# Patient Record
Sex: Female | Born: 1990 | Race: White | Hispanic: No | Marital: Single
Health system: Southern US, Community
[De-identification: ages and names within clinical notes are randomized; demographics above are authoritative.]

---
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
## 2011-01-20 ENCOUNTER — Institutional Professional Consult (permissible substitution) (INDEPENDENT_AMBULATORY_CARE_PROVIDER_SITE_OTHER): Payer: Commercial Managed Care - PPO | Admitting: Pediatrics

## 2011-01-20 DIAGNOSIS — R279 Unspecified lack of coordination: Secondary | ICD-10-CM

## 2011-01-20 DIAGNOSIS — F909 Attention-deficit hyperactivity disorder, unspecified type: Secondary | ICD-10-CM

## 2011-01-20 DIAGNOSIS — R6250 Unspecified lack of expected normal physiological development in childhood: Secondary | ICD-10-CM

## 2011-03-30 ENCOUNTER — Institutional Professional Consult (permissible substitution) (INDEPENDENT_AMBULATORY_CARE_PROVIDER_SITE_OTHER): Payer: Commercial Managed Care - PPO | Admitting: Pediatrics

## 2011-03-30 DIAGNOSIS — F909 Attention-deficit hyperactivity disorder, unspecified type: Secondary | ICD-10-CM

## 2011-03-30 DIAGNOSIS — R6250 Unspecified lack of expected normal physiological development in childhood: Secondary | ICD-10-CM

## 2011-03-30 DIAGNOSIS — R279 Unspecified lack of coordination: Secondary | ICD-10-CM

## 2011-06-14 ENCOUNTER — Encounter (INDEPENDENT_AMBULATORY_CARE_PROVIDER_SITE_OTHER): Payer: Commercial Managed Care - PPO | Admitting: Pediatrics

## 2011-06-14 DIAGNOSIS — F909 Attention-deficit hyperactivity disorder, unspecified type: Secondary | ICD-10-CM

## 2011-06-14 DIAGNOSIS — R279 Unspecified lack of coordination: Secondary | ICD-10-CM

## 2011-06-16 ENCOUNTER — Ambulatory Visit
Admission: RE | Admit: 2011-06-16 | Discharge: 2011-06-16 | Disposition: A | Discharge status: Home / Self Care | Payer: Commercial Managed Care - PPO | Source: Ambulatory Visit | Attending: Allergy | Admitting: Allergy

## 2011-06-16 ENCOUNTER — Other Ambulatory Visit: Payer: Self-pay | Admitting: Allergy

## 2011-06-16 DIAGNOSIS — J209 Acute bronchitis, unspecified: Secondary | ICD-10-CM

## 2011-11-30 ENCOUNTER — Institutional Professional Consult (permissible substitution) (INDEPENDENT_AMBULATORY_CARE_PROVIDER_SITE_OTHER): Payer: Commercial Managed Care - PPO | Admitting: Pediatrics

## 2011-11-30 ENCOUNTER — Institutional Professional Consult (permissible substitution): Payer: Commercial Managed Care - PPO | Admitting: Pediatrics

## 2011-11-30 DIAGNOSIS — F909 Attention-deficit hyperactivity disorder, unspecified type: Secondary | ICD-10-CM

## 2011-11-30 DIAGNOSIS — R279 Unspecified lack of coordination: Secondary | ICD-10-CM

## 2012-01-23 ENCOUNTER — Institutional Professional Consult (permissible substitution) (INDEPENDENT_AMBULATORY_CARE_PROVIDER_SITE_OTHER): Payer: Commercial Managed Care - PPO | Admitting: Pediatrics

## 2012-01-23 DIAGNOSIS — F909 Attention-deficit hyperactivity disorder, unspecified type: Secondary | ICD-10-CM

## 2012-01-23 DIAGNOSIS — R279 Unspecified lack of coordination: Secondary | ICD-10-CM

## 2012-06-06 ENCOUNTER — Institutional Professional Consult (permissible substitution): Payer: Commercial Managed Care - PPO | Admitting: Pediatrics

## 2012-06-11 ENCOUNTER — Institutional Professional Consult (permissible substitution) (INDEPENDENT_AMBULATORY_CARE_PROVIDER_SITE_OTHER): Payer: Commercial Managed Care - PPO | Admitting: Pediatrics

## 2012-06-11 DIAGNOSIS — R279 Unspecified lack of coordination: Secondary | ICD-10-CM

## 2012-06-11 DIAGNOSIS — F909 Attention-deficit hyperactivity disorder, unspecified type: Secondary | ICD-10-CM

## 2012-11-05 ENCOUNTER — Institutional Professional Consult (permissible substitution) (INDEPENDENT_AMBULATORY_CARE_PROVIDER_SITE_OTHER): Payer: Commercial Managed Care - PPO | Admitting: Pediatrics

## 2012-11-05 DIAGNOSIS — F909 Attention-deficit hyperactivity disorder, unspecified type: Secondary | ICD-10-CM

## 2012-11-05 DIAGNOSIS — R279 Unspecified lack of coordination: Secondary | ICD-10-CM

## 2013-05-26 IMAGING — CR DG CHEST 2V
2 series · 2 of 2 positions shown · non-contrast
Comparison: PA and lateral chest 03/29/2008.

CLINICAL DATA: Cough and shortness of breath.

CHEST - 2 VIEW

[view not recorded (1 of 2)]
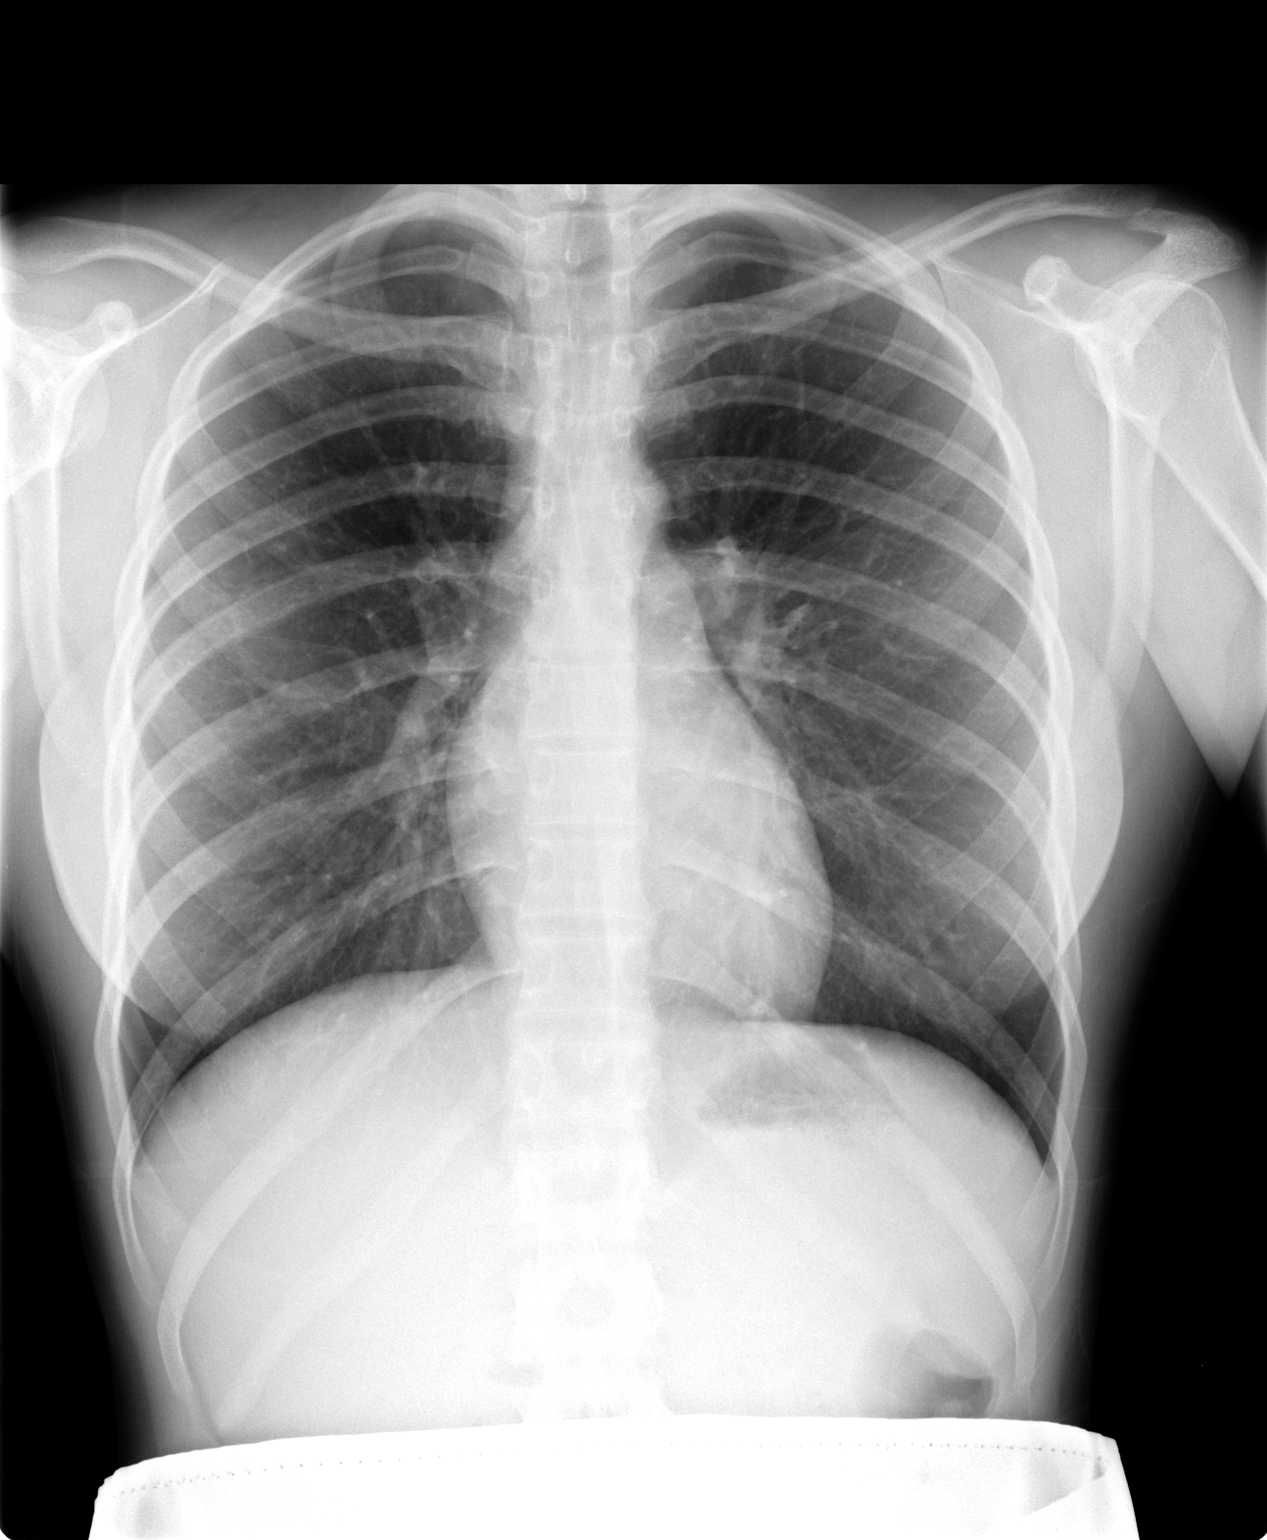

[view not recorded (2 of 2)]
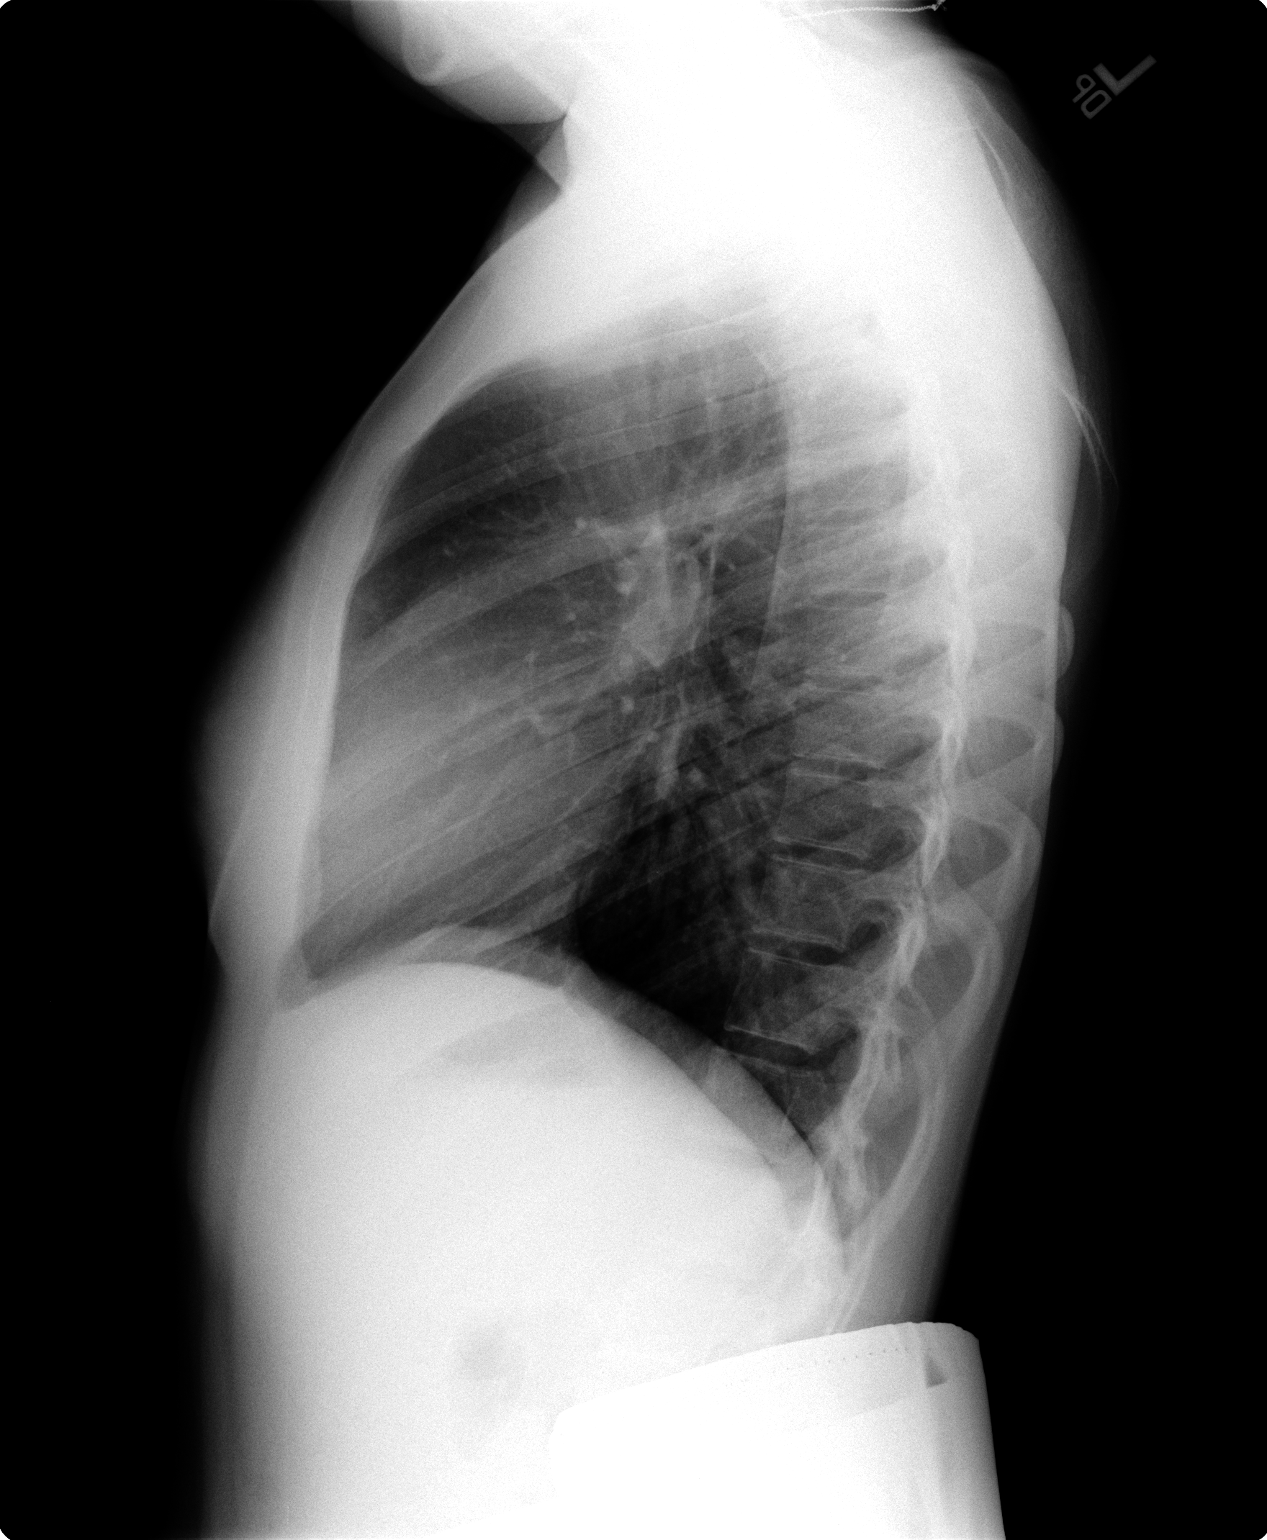

[2 of 2 positions shown; findings below may reference images not displayed]

FINDINGS: Lungs are clear.  Heart size is normal.  No pneumothorax
or effusion.
IMPRESSION: Negative chest.

## 2013-06-18 ENCOUNTER — Encounter (INDEPENDENT_AMBULATORY_CARE_PROVIDER_SITE_OTHER): Payer: Commercial Managed Care - PPO | Admitting: Pediatrics

## 2013-06-18 DIAGNOSIS — F909 Attention-deficit hyperactivity disorder, unspecified type: Secondary | ICD-10-CM

## 2013-06-18 DIAGNOSIS — R279 Unspecified lack of coordination: Secondary | ICD-10-CM

## 2013-06-25 ENCOUNTER — Institutional Professional Consult (permissible substitution): Payer: Commercial Managed Care - PPO | Admitting: Pediatrics

## 2013-12-10 ENCOUNTER — Institutional Professional Consult (permissible substitution) (INDEPENDENT_AMBULATORY_CARE_PROVIDER_SITE_OTHER): Payer: Commercial Managed Care - PPO | Admitting: Pediatrics

## 2013-12-10 DIAGNOSIS — R279 Unspecified lack of coordination: Secondary | ICD-10-CM

## 2013-12-10 DIAGNOSIS — F909 Attention-deficit hyperactivity disorder, unspecified type: Secondary | ICD-10-CM

## 2014-06-30 ENCOUNTER — Institutional Professional Consult (permissible substitution): Payer: Commercial Managed Care - PPO | Admitting: Pediatrics

## 2014-06-30 DIAGNOSIS — F8181 Disorder of written expression: Secondary | ICD-10-CM

## 2014-06-30 DIAGNOSIS — F902 Attention-deficit hyperactivity disorder, combined type: Secondary | ICD-10-CM

## 2014-11-24 ENCOUNTER — Institutional Professional Consult (permissible substitution): Payer: Commercial Managed Care - PPO | Admitting: Pediatrics

## 2014-12-30 ENCOUNTER — Institutional Professional Consult (permissible substitution): Payer: Commercial Managed Care - PPO | Admitting: Pediatrics

## 2014-12-30 DIAGNOSIS — F902 Attention-deficit hyperactivity disorder, combined type: Secondary | ICD-10-CM | POA: Diagnosis not present

## 2014-12-30 DIAGNOSIS — F8181 Disorder of written expression: Secondary | ICD-10-CM | POA: Diagnosis not present

## 2015-05-06 ENCOUNTER — Institutional Professional Consult (permissible substitution): Payer: Commercial Managed Care - PPO | Admitting: Pediatrics

## 2015-05-06 DIAGNOSIS — F902 Attention-deficit hyperactivity disorder, combined type: Secondary | ICD-10-CM | POA: Diagnosis not present

## 2015-05-06 DIAGNOSIS — F8181 Disorder of written expression: Secondary | ICD-10-CM | POA: Diagnosis not present

## 2017-06-28 ENCOUNTER — Telehealth: Payer: Self-pay | Admitting: Family

## 2017-06-28 NOTE — Telephone Encounter (Signed)
Faxed medical records to [facility], per patient request. tl
